# Patient Record
Sex: Male | Born: 1995 | Race: White | Hispanic: No | Marital: Single | Smoking: Former smoker
Health system: Southern US, Community
[De-identification: ages and names within clinical notes are randomized; demographics above are authoritative.]

## PROBLEM LIST (undated history)

## (undated) DIAGNOSIS — S62009A Unspecified fracture of navicular [scaphoid] bone of unspecified wrist, initial encounter for closed fracture: Secondary | ICD-10-CM

## (undated) DIAGNOSIS — F1290 Cannabis use, unspecified, uncomplicated: Secondary | ICD-10-CM

## (undated) DIAGNOSIS — Z8619 Personal history of other infectious and parasitic diseases: Secondary | ICD-10-CM

## (undated) DIAGNOSIS — Z8249 Family history of ischemic heart disease and other diseases of the circulatory system: Secondary | ICD-10-CM

## (undated) HISTORY — DX: Cannabis use, unspecified, uncomplicated: F12.90

## (undated) HISTORY — DX: Personal history of other infectious and parasitic diseases: Z86.19

## (undated) HISTORY — DX: Unspecified fracture of navicular (scaphoid) bone of unspecified wrist, initial encounter for closed fracture: S62.009A

## (undated) HISTORY — DX: Family history of ischemic heart disease and other diseases of the circulatory system: Z82.49

## (undated) HISTORY — PX: NO PAST SURGERIES: SHX2092

---
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
## 2016-01-01 NOTE — Telephone Encounter (Signed)
Rx request Denied; Last Rx to pharmacy 11/21/15, dipense 60 with 10 refills-Too Soon/SLS 11/27

## 2016-01-08 ENCOUNTER — Other Ambulatory Visit: Payer: Self-pay | Admitting: Family Medicine

## 2016-01-11 NOTE — Telephone Encounter (Signed)
[facility] and he found the 11/21/15 renewal and will get rx ready. Pt voiced understanding.

## 2016-02-18 ENCOUNTER — Telehealth: Payer: Self-pay | Admitting: Family Medicine

## 2016-02-18 MED ORDER — BUPROPION HCL ER (SR) 150 MG PO TB12: 150.0000 mg | ORAL_TABLET | Freq: Every day | ORAL | 1 refills | Status: DC

## 2016-02-18 NOTE — Telephone Encounter (Signed)
Due to formulary change, mom requests change wellbutrin to 150mg  dose. Sent in.  plz clarify with patient or mom - wellbutrin 150mg  SR should not be cut in half. Pt previously on 75mg  BID, will change to 150mg  SR once daily so overall same dose. Let me know if any questions.

## 2016-02-19 NOTE — Telephone Encounter (Signed)
Message left advising patient's mother.  

## 2016-05-14 ENCOUNTER — Encounter: Payer: Self-pay | Admitting: Family Medicine

## 2016-05-14 ENCOUNTER — Ambulatory Visit (INDEPENDENT_AMBULATORY_CARE_PROVIDER_SITE_OTHER): Payer: 59 | Admitting: Family Medicine

## 2016-05-14 VITALS — BP 112/76 | HR 100 | Temp 99.0°F | Wt 158.5 lb

## 2016-05-14 DIAGNOSIS — J029 Acute pharyngitis, unspecified: Secondary | ICD-10-CM | POA: Diagnosis not present

## 2016-05-14 LAB — POCT RAPID STREP A (OFFICE): Rapid Strep A Screen: NEGATIVE

## 2016-05-14 NOTE — Progress Notes (Signed)
Pre visit review using our clinic review tool, if applicable. No additional management support is needed unless otherwise documented below in the visit note. 

## 2016-05-14 NOTE — Patient Instructions (Signed)
You have acute pharyngitis. Push fluids and plenty of rest. May use ibuprofen for throat inflammation.  Salt water gargles.  Suck on cold things like popsicles or warm things like herbal teas (whichever soothes the throat better). Return if fever >101.5, worsening pain, or trouble opening/closing mouth, or hoarse voice. Good to see you today, call clinic with questions.  

## 2016-05-14 NOTE — Assessment & Plan Note (Signed)
RST negative today.  Anticipate viral given short duration Supportive care reviewed. Update if persistent symptoms or red flags. Pt agrees with plan.  Work and school notes provided today.

## 2016-05-14 NOTE — Progress Notes (Signed)
BP 112/76   Pulse 100   Temp 99 F (37.2 C) (Oral)   Wt 158 lb 8 oz (71.9 kg)   BMI 20.91 kg/m    CC: ST, fever Subjective:    Patient ID: Edward Murillo, male    DOB: 1995-08-02, 21 y.o.   MRN: [id number]  HPI: Edward Murillo is a 21 y.o. male presenting on 05/14/2016 for Sore Throat   3d h/o ST and PNDrainage with low grade fever. Mild nausea overnight.   Treating with advil and benadryl with some improvement No sick contacts at home. No h/o asthma Quit smoking several months ago.   No ear pain, tooth pain, headaches, congestion, cough, swollen glands. No abd pain. No increased fatigue.   Relevant past medical, surgical, family and social history reviewed and updated as indicated. Interim medical history since our last visit reviewed. Allergies and medications reviewed and updated. Outpatient Medications Prior to Visit  Medication Sig Dispense Refill  . buPROPion (WELLBUTRIN SR) 150 MG 12 hr tablet Take 1 tablet (150 mg total) by mouth daily. 90 tablet 1   No facility-administered medications prior to visit.      Per HPI unless specifically indicated in ROS section below Review of Systems     Objective:    BP 112/76   Pulse 100   Temp 99 F (37.2 C) (Oral)   Wt 158 lb 8 oz (71.9 kg)   BMI 20.91 kg/m   Wt Readings from Last 3 Encounters:  05/14/16 158 lb 8 oz (71.9 kg) (57 %, Z= 0.16)*  11/21/15 161 lb 12 oz (73.4 kg) (64 %, Z= 0.35)*  11/10/15 155 lb (70.3 kg) (54 %, Z= 0.10)*   * Growth percentiles are based on CDC 2-20 Years data.    Physical Exam  Constitutional: He appears well-developed and well-nourished. No distress.  HENT:  Head: Normocephalic and atraumatic.  Right Ear: Hearing, tympanic membrane, external ear and ear canal normal.  Left Ear: Hearing, tympanic membrane, external ear and ear canal normal.  Nose: Nose normal. No mucosal edema or rhinorrhea. Right sinus exhibits no maxillary sinus tenderness and no frontal sinus tenderness. Left  sinus exhibits no maxillary sinus tenderness and no frontal sinus tenderness.  Mouth/Throat: Uvula is midline and mucous membranes are normal. Posterior oropharyngeal edema and posterior oropharyngeal erythema present. No oropharyngeal exudate or tonsillar abscesses.  Raw posterior oropharynx Mild nasal mucosal congestion  Eyes: Conjunctivae and EOM are normal. Pupils are equal, round, and reactive to light. No scleral icterus.  Neck: Normal range of motion. Neck supple.  Cardiovascular: Normal rate, regular rhythm, normal heart sounds and intact distal pulses.   No murmur heard. Pulmonary/Chest: Effort normal and breath sounds normal. No respiratory distress. He has no wheezes. He has no rales.  Abdominal: There is no hepatosplenomegaly. There is no tenderness.  Lymphadenopathy:    He has no cervical adenopathy.  Skin: Skin is warm and dry. No rash noted.  Nursing note and vitals reviewed.  Results for orders placed or performed in visit on 05/14/16  POCT rapid strep A  Result Value Ref Range   Rapid Strep A Screen Negative Negative      Assessment & Plan:   Problem List Items Addressed This Visit    Viral pharyngitis - Primary    RST negative today.  Anticipate viral given short duration Supportive care reviewed. Update if persistent symptoms or red flags. Pt agrees with plan.  Work and school notes provided today.  Other Visit Diagnoses    Sore throat       Relevant Orders   POCT rapid strep A (Completed)       Follow up plan: Return if symptoms worsen or fail to improve.  Edward Boyden, MD

## 2016-06-04 ENCOUNTER — Emergency Department (HOSPITAL_COMMUNITY): Payer: 59

## 2016-06-04 ENCOUNTER — Encounter (HOSPITAL_COMMUNITY): Payer: Self-pay | Admitting: Emergency Medicine

## 2016-06-04 ENCOUNTER — Emergency Department (HOSPITAL_COMMUNITY)
Admission: EM | Admit: 2016-06-04 | Discharge: 2016-06-04 | Disposition: A | Discharge status: Home / Self Care | Payer: 59 | Attending: Emergency Medicine | Admitting: Emergency Medicine

## 2016-06-04 DIAGNOSIS — Z87891 Personal history of nicotine dependence: Secondary | ICD-10-CM | POA: Insufficient documentation

## 2016-06-04 DIAGNOSIS — R109 Unspecified abdominal pain: Secondary | ICD-10-CM | POA: Diagnosis present

## 2016-06-04 DIAGNOSIS — N200 Calculus of kidney: Secondary | ICD-10-CM | POA: Diagnosis not present

## 2016-06-04 LAB — URINALYSIS, MICROSCOPIC (REFLEX)

## 2016-06-04 LAB — BASIC METABOLIC PANEL
Anion gap: 9 (ref 5–15)
BUN: 10 mg/dL (ref 6–20)
CHLORIDE: 104 mmol/L (ref 101–111)
CO2: 26 mmol/L (ref 22–32)
CREATININE: 1.04 mg/dL (ref 0.61–1.24)
Calcium: 9.9 mg/dL (ref 8.9–10.3)
GFR calc Af Amer: >60 mL/min (ref >60)
GFR calc non Af Amer: >60 mL/min (ref >60)
Glucose, Bld: 113 mg/dL — ABNORMAL HIGH (ref 65–99)
Potassium: 3.9 mmol/L (ref 3.5–5.1)
Sodium: 139 mmol/L (ref 135–145)

## 2016-06-04 LAB — CBC
HCT: 43.7 % (ref 39.0–52.0)
Hemoglobin: 14.5 g/dL (ref 13.0–17.0)
MCH: 29.4 pg (ref 26.0–34.0)
MCHC: 33.2 g/dL (ref 30.0–36.0)
MCV: 88.5 fL (ref 78.0–100.0)
PLATELETS: 394 10*3/uL (ref 150–400)
RBC: 4.94 MIL/uL (ref 4.22–5.81)
RDW: 13.3 % (ref 11.5–15.5)
WBC: 11.9 10*3/uL — ABNORMAL HIGH (ref 4.0–10.5)

## 2016-06-04 LAB — URINALYSIS, ROUTINE W REFLEX MICROSCOPIC
Glucose, UA: NEGATIVE mg/dL
Ketones, ur: >80 mg/dL — AB
Nitrite: NEGATIVE
Protein, ur: 100 mg/dL — AB
pH: 6 (ref 5.0–8.0)

## 2016-06-04 NOTE — Discharge Instructions (Signed)
Please read attached information. If you experience any new or worsening signs or symptoms please return to the emergency room for evaluation. Please follow-up with your primary care provider or specialist as discussed.  °

## 2016-06-04 NOTE — ED Notes (Signed)
PT requesting food- this rn notified him we would provide food once CT results

## 2016-06-04 NOTE — ED Provider Notes (Signed)
[facility]-EMERGENCY DEPT Provider Note   CSN: [id number] Arrival date & time: 06/04/16  0714     History   Chief Complaint Chief Complaint  Patient presents with  . Flank Pain    HPI Edward Murillo is a 21 y.o. male.  HPI   21 year old male presents today with left flank pain.  Patient reports yesterday he had lower back pain which is not abnormal for him.  He notes that this acutely worsened with a sharp stabbing pain higher up in his flank.  He notes this pain radiated around into his left groin.  He notes this was very severe, associated with nausea and vomiting.  He denies any preceding trauma, infectious etiology.  He reports a family history of kidney stones, no personal history of the same.  He denies any preceding urinary complaints, reports that his urine has appeared slightly cloudy.  Prior to my evaluation patient notes that symptoms have completely resolved and is no longer symptomatic.  He denies any abdominal pain, flank pain, nausea or vomiting at this time.   Past Medical History:  Diagnosis Date  . Family history of premature CAD   . History of mononucleosis 11/2013  . Marijuana smoker   . Scaphoid fracture of wrist 2009   R - treated with thumb spica splint    Patient Active Problem List   Diagnosis Date Noted  . Viral pharyngitis 05/14/2016  . Depression with anxiety 10/24/2015  . Well adolescent visit 03/04/2014  . Tremor 03/04/2014    Past Surgical History:  Procedure Laterality Date  . CLOSED REDUCTION METACARPAL WITH PERCUTANEOUS PINNING Right 10/17/2014   Procedure: RIGHT FOURTH AND FIFTH [facility] CLOSED MANIPULATION AND PINNING;  Surgeon: Bradly Bienenstock, MD;  Location: [facility] OR;  Service: Orthopedics;  Laterality: Right;  . NO PAST SURGERIES         Home Medications    Prior to Admission medications   Medication Sig Start Date End Date Taking? Authorizing Provider  acetaminophen (TYLENOL) 325 MG tablet Take 650 mg by mouth every 6 (six) hours as needed for  mild pain.   Yes Historical Provider, MD  buPROPion (WELLBUTRIN SR) 150 MG 12 hr tablet Take 1 tablet (150 mg total) by mouth daily. 02/18/16  Yes Eustaquio Boyden, MD  ibuprofen (ADVIL,MOTRIN) 200 MG tablet Take 200 mg by mouth every 6 (six) hours as needed for moderate pain.   Yes Historical Provider, MD    Family History Family History  Problem Relation Age of Onset  . CAD Mother 49    MI  . CAD Father 26    MI  . CAD Maternal Grandfather     CABG  . Cancer Paternal Grandmother     lung (smoker)  . Cancer Paternal Grandfather     leukemia  . Sudden death Paternal Grandfather     unsure  . Alcohol abuse Maternal Uncle   . Drug abuse Brother     lives in [place]    Social History Social History  Substance Use Topics  . Smoking status: Former Smoker    Types: Cigarettes  . Smokeless tobacco: Never Used     Comment: quit early 2018  . Alcohol use 0.0 oz/week     Comment: rare, no hangovers     Allergies   Amoxicillin   Review of Systems Review of Systems  All other systems reviewed and are negative.  Physical Exam Updated Vital Signs BP 117/68   Pulse 66   Temp 98 F (36.7 C) (Oral)  Resp 16   SpO2 99%   Physical Exam  Constitutional: He is oriented to person, place, and time. He appears well-developed and well-nourished.  HENT:  Head: Normocephalic and atraumatic.  Eyes: Conjunctivae are normal. Pupils are equal, round, and reactive to light. Right eye exhibits no discharge. Left eye exhibits no discharge. No scleral icterus.  Neck: Normal range of motion. No JVD present. No tracheal deviation present.  Pulmonary/Chest: Effort normal. No stridor.  Abdominal: Soft. He exhibits no distension and no mass. There is no tenderness. There is no rebound and no guarding. No hernia.  No CVA tenderness  Neurological: He is alert and oriented to person, place, and time. Coordination normal.  Psychiatric: He has a normal mood and affect. His behavior is normal.  Judgment and thought content normal.  Nursing note and vitals reviewed.   ED Treatments / Results  Labs (all labs ordered are listed, but only abnormal results are displayed) Labs Reviewed  URINALYSIS, ROUTINE W REFLEX MICROSCOPIC - Abnormal; Notable for the following:       Result Value   Color, Urine BROWN (*)    APPearance CLOUDY (*)    Specific Gravity, Urine >1.030 (*)    Hgb urine dipstick LARGE (*)    Bilirubin Urine SMALL (*)    Ketones, ur >80 (*)    Protein, ur 100 (*)    Leukocytes, UA TRACE (*)    All other components within normal limits  BASIC METABOLIC PANEL - Abnormal; Notable for the following:    Glucose, Bld 113 (*)    All other components within normal limits  CBC - Abnormal; Notable for the following:    WBC 11.9 (*)    All other components within normal limits  URINALYSIS, MICROSCOPIC (REFLEX) - Abnormal; Notable for the following:    Bacteria, UA FEW (*)    Squamous Epithelial / LPF 0-5 (*)    All other components within normal limits    EKG  EKG Interpretation None       Radiology Ct Renal Stone Study  Result Date: 06/04/2016 CLINICAL DATA:  Left flank pain, nausea, hematuria EXAM: CT ABDOMEN AND PELVIS WITHOUT CONTRAST TECHNIQUE: Multidetector CT imaging of the abdomen and pelvis was performed following the standard protocol without IV contrast. COMPARISON:  None. FINDINGS: Lower chest: Lung bases are clear. Hepatobiliary: Unenhanced liver is unremarkable. Gallbladder is unremarkable. No intrahepatic or extrahepatic duct dilatation. Pancreas: Within normal limits. Spleen: Within normal limits. Adrenals/Urinary Tract: Adrenal glands are within normal limits. Punctate nonobstructing right upper pole renal calculus (coronal image 72). Kidneys are otherwise within normal limits. No ureteral or bladder calculi. No hydronephrosis. Bladder is within normal limits. Stomach/Bowel: Stomach is within normal limits. No evidence of bowel obstruction. Normal  appendix (series 3/ image 65). Vascular/Lymphatic: No evidence of abdominal aortic aneurysm. No suspicious abdominopelvic lymphadenopathy. Reproductive: Prostate is grossly unremarkable. Other: No abdominopelvic ascites. Musculoskeletal: Visualized osseous structures are within normal limits. IMPRESSION: Punctate nonobstructing right upper pole renal calculus. No ureteral or bladder calculi.  No hydronephrosis. Electronically Signed   By: Charline Bills M.D.   On: 06/04/2016 12:03    Procedures Procedures (including critical care time)  Medications Ordered in ED Medications - No data to display   Initial Impression / Assessment and Plan / ED Course  I have reviewed the triage vital signs and the nursing notes.  Pertinent labs & imaging results that were available during my care of the patient were reviewed by me and considered in my medical  decision making (see chart for details).    Final Clinical Impressions(s) / ED Diagnoses   Final diagnoses:  Nephrolithiasis    Labs: CBC, BMP, urinalysis  Imaging: CT renal  Assessment/Plan: Patient's presentation is consistent with nephrolithiasis.  Patient does not have a personal history of it, but has close family members with similar presentations and history nephrolithiasis.  I advised that CT scan would be most reasonable for evaluation of stone size, location, hydronephrosis, or any complicating features.  Mother is at bedside who questions need of CT at this time.  She would like to obtain urinalysis prior to any imaging study.  That shows significant red blood cells, no signs of infection.  Patient does not have any previous imaging, likely this is nephrolithiasis.  Both the patient and his mother and agreement with CT scan to rule out obstruction and to verify stone..  CT scan returned showing right-sided nephrolithiasis, no left-sided involvement.  Patient likely has Artie passed this stone given the urinalysis and presentation.  He  has remained asymptomatic throughout his stay here in the ED.  He will be referred to urology for ongoing management of nephrolithiasis, is given strict return precautions.  He verbalized understanding and agreement to today's plan and no further questions or concerns.  New Prescriptions New Prescriptions   No medications on file     Eyvonne Mechanic, PA-C 06/04/16 1237    Canary Brim Tegeler, MD 06/04/16 [phone]

## 2016-06-04 NOTE — ED Notes (Signed)
Family at bedside.  Got patient ready for discharge

## 2016-06-04 NOTE — ED Notes (Signed)
Patient given something to drink per EDP approval  

## 2016-06-04 NOTE — ED Notes (Signed)
Patient aware that a urine sample is needed 

## 2016-06-04 NOTE — ED Notes (Signed)
Patient transported to CT 

## 2016-06-04 NOTE — ED Triage Notes (Signed)
Pt reports left flank pain and vomiting since yesterday. Reports pain has subsided at this time, family hx of kidney stones.

## 2016-07-15 ENCOUNTER — Other Ambulatory Visit: Payer: Self-pay | Admitting: Family Medicine

## 2016-10-04 ENCOUNTER — Other Ambulatory Visit: Payer: Self-pay | Admitting: Family Medicine

## 2018-09-11 ENCOUNTER — Other Ambulatory Visit: Payer: Self-pay

## 2018-09-11 ENCOUNTER — Encounter (HOSPITAL_COMMUNITY): Payer: Self-pay | Admitting: Emergency Medicine

## 2018-09-11 ENCOUNTER — Emergency Department (HOSPITAL_COMMUNITY)
Admission: EM | Admit: 2018-09-11 | Discharge: 2018-09-11 | Disposition: A | Discharge status: Home / Self Care | Payer: 59 | Attending: Emergency Medicine | Admitting: Emergency Medicine

## 2018-09-11 DIAGNOSIS — R310 Gross hematuria: Secondary | ICD-10-CM | POA: Insufficient documentation

## 2018-09-11 DIAGNOSIS — Z79899 Other long term (current) drug therapy: Secondary | ICD-10-CM | POA: Insufficient documentation

## 2018-09-11 LAB — URINALYSIS, ROUTINE W REFLEX MICROSCOPIC

## 2018-09-11 LAB — URINALYSIS, MICROSCOPIC (REFLEX)

## 2018-09-11 MED ORDER — HYDROCODONE-ACETAMINOPHEN 5-325 MG PO TABS
1.0000 | ORAL_TABLET | Freq: Once | ORAL | Status: AC
  Administered 2018-09-11: 1 via ORAL
  Filled 2018-09-11: qty 1

## 2018-09-11 NOTE — ED Provider Notes (Signed)
[facility] EMERGENCY DEPARTMENT Provider Note   CSN: [id number] Arrival date & time: 09/11/18  0932    History   Chief Complaint No chief complaint on file.   HPI Edward Murillo is a 23 y.o. male.     The history is provided by the patient and medical records. No language interpreter was used.   Edward Murillo is a 23 y.o. male  with a PMH as listed below who presents to the Emergency Department complaining of hematuria.  Patient states that he has a history of kidney stones in the past.  2 days ago, he had left back and flank pain which felt similar to his prior stone.  Yesterday, he felt as if his pain peaked.  Associated with nausea and a couple episodes of emesis.  His pain started radiating towards the left lower quadrant.  Today, he feels as if his pain is much better.  Went from 8-9/10 down to 2/10.  Mostly just feels a little uncomfortable with urination.  When he urinated today, he noticed blood in his urine which concerned him prompting him to come to the emergency department.  He denies any fever or chills.  Currently not nauseous and has not had any vomiting today.  Overall feels much improved, but concerned about hematuria.  Past Medical History:  Diagnosis Date   Family history of premature CAD    History of mononucleosis 11/2013   Marijuana smoker    Scaphoid fracture of wrist 2009   R - treated with thumb spica splint    Patient Active Problem List   Diagnosis Date Noted   Viral pharyngitis 05/14/2016   Depression with anxiety 10/24/2015   Well adolescent visit 03/04/2014   Tremor 03/04/2014    Past Surgical History:  Procedure Laterality Date   CLOSED REDUCTION METACARPAL WITH PERCUTANEOUS PINNING Right 10/17/2014   Procedure: RIGHT FOURTH AND FIFTH [facility] CLOSED MANIPULATION AND PINNING;  Surgeon: Bradly BienenstockFred Ortmann, MD;  Location: [facility] OR;  Service: Orthopedics;  Laterality: Right;   NO PAST SURGERIES          Home Medications     Prior to Admission medications   Medication Sig Start Date End Date Taking? Authorizing Provider  acetaminophen (TYLENOL) 325 MG tablet Take 650 mg by mouth every 6 (six) hours as needed for mild pain.    [provider]  buPROPion [facility](WELLBUTRIN SR) 150 MG 12 hr tablet TAKE 1 TABLET BY MOUTH  DAILY 10/05/16   Eustaquio BoydenGutierrez, Javier, MD  ibuprofen (ADVIL,MOTRIN) 200 MG tablet Take 200 mg by mouth every 6 (six) hours as needed for moderate pain.    [provider]    Family History Family History  Problem Relation Age of Onset   CAD Mother 9949       MI   CAD Father 5553       MI   CAD Maternal Grandfather        CABG   Cancer Paternal Grandmother        lung (smoker)   Cancer Paternal Grandfather        leukemia   Sudden death Paternal Grandfather        unsure   Alcohol abuse Maternal Uncle    Drug abuse Brother        lives in [place]    Social History Social History   Tobacco Use   Smoking status: Former Smoker    Types: Cigarettes   Smokeless tobacco: Never Used   Tobacco comment: quit  early 2018  Substance Use Topics   Alcohol use: Yes    Alcohol/week: 0.0 standard drinks    Comment: rare, no hangovers   Drug use: Yes    Types: Marijuana    Comment: MJ occasionally     Allergies   Amoxicillin   Review of Systems Review of Systems  Gastrointestinal: Positive for abdominal pain, nausea (Resolved) and vomiting (Resolved).  Genitourinary: Positive for dysuria, flank pain (Resolved) and hematuria.  Musculoskeletal: Positive for back pain (Resolved).  All other systems reviewed and are negative.    Physical Exam Updated Vital Signs BP 111/65    Pulse 64    Temp 98.2 F (36.8 C) (Oral)    Resp 16    Ht 6\' 2"  (1.88 m)    Wt 68 kg    SpO2 98%    BMI 19.26 kg/m   Physical Exam Vitals signs and nursing note reviewed.  Constitutional:      General: He is not in acute distress.    Appearance: He is well-developed.     Comments:  Nontoxic, well appearing.  HENT:     Head: Normocephalic and atraumatic.  Neck:     Musculoskeletal: Neck supple.  Cardiovascular:     Rate and Rhythm: Normal rate and regular rhythm.     Heart sounds: Normal heart sounds. No murmur.  Pulmonary:     Effort: Pulmonary effort is normal. No respiratory distress.     Breath sounds: Normal breath sounds.  Abdominal:     General: There is no distension.     Palpations: Abdomen is soft.     Comments: No abdominal, flank or CVA tenderness.  Skin:    General: Skin is warm and dry.  Neurological:     Mental Status: He is alert and oriented to person, place, and time.      ED Treatments / Results  Labs (all labs ordered are listed, but only abnormal results are displayed) Labs Reviewed  URINALYSIS, ROUTINE W REFLEX MICROSCOPIC - Abnormal; Notable for the following components:      Result Value   Color, Urine RED (*)    APPearance HAZY (*)    Glucose, UA   (*)    Value: TEST NOT REPORTED DUE TO COLOR INTERFERENCE OF URINE PIGMENT   Hgb urine dipstick   (*)    Value: TEST NOT REPORTED DUE TO COLOR INTERFERENCE OF URINE PIGMENT   Bilirubin Urine   (*)    Value: TEST NOT REPORTED DUE TO COLOR INTERFERENCE OF URINE PIGMENT   Ketones, ur   (*)    Value: TEST NOT REPORTED DUE TO COLOR INTERFERENCE OF URINE PIGMENT   Protein, ur   (*)    Value: TEST NOT REPORTED DUE TO COLOR INTERFERENCE OF URINE PIGMENT   Nitrite   (*)    Value: TEST NOT REPORTED DUE TO COLOR INTERFERENCE OF URINE PIGMENT   Leukocytes,Ua   (*)    Value: TEST NOT REPORTED DUE TO COLOR INTERFERENCE OF URINE PIGMENT   All other components within normal limits  URINALYSIS, MICROSCOPIC (REFLEX) - Abnormal; Notable for the following components:   Bacteria, UA RARE (*)    All other components within normal limits    EKG None  Radiology No results found.  Procedures Procedures (including critical care time)  Medications Ordered in ED Medications   HYDROcodone-acetaminophen (NORCO/VICODIN) 5-325 MG per tablet 1 tablet (1 tablet Oral Given 09/11/18 1054)     Initial Impression / Assessment and Plan /  ED Course  I have reviewed the triage vital signs and the nursing notes.  Pertinent labs & imaging results that were available during my care of the patient were reviewed by me and considered in my medical decision making (see chart for details).       Edward Murillo is a 23 y.o. male who presents to ED for hematuria.  He has known history of kidney stones and had back / flank pain similar to previous stones 2 days ago and yesterday.  Today, he feels as if his pain is much better.  Overall, he feels very much improved, but had hematuria when urinating 2 times today which concerned him and prompted him to come to the emergency department.  On exam, he is afebrile, very well-appearing with normal vital signs. He has no abdominal, flank or CVA tenderness. History sounds most consistent with passing a kidney stone.  He reports being very afraid of needles and would like to avoid any blood draws or needles if possible.  Considering he has normal physical examination and improving symptoms, feel it is reasonable in this otherwise healthy 23 year old male to hold off on labs.  Given dysuria/hematuria will check a urine sample to make sure there is no signs of infection, but feel hematuria is likely secondary to recently passed stone.  UA difficult to interpret given interference of urine pigment 2/2 RBC'w. Does show 11-20 WBC with rare bacteria. Doubt UTI. Did discuss strict return precautions including fever, vomiting, return of back/flank/abdominal pain. Urology referral given. Again offered further work up which patient declined. All questions answered.   Final Clinical Impressions(s) / ED Diagnoses   Final diagnoses:  Gross hematuria    ED Discharge Orders    None       Delayza Lungren, Ozella Almond, PA-C 09/11/18 1212    Maudie Flakes,  MD 09/14/18 [phone]

## 2018-09-11 NOTE — Discharge Instructions (Signed)
It was my pleasure taking care of you today!   Please call the urology clinic listed to schedule a follow up appointment.   Return to ER for fever, return of severe abdominal pain / vomiting, new or worsening symptoms, any additional concerns.

## 2018-09-11 NOTE — ED Triage Notes (Signed)
Pt  Here with c/o kidney stone pain history of same , pt states that he is noticeably blood in his urine pt thinks he has almost passed the stone

## 2018-09-11 NOTE — ED Notes (Signed)
Patient verbalizes understanding of discharge instructions. Opportunity for questioning and answers were provided. Pt discharged from ED. 

## 2018-11-23 ENCOUNTER — Other Ambulatory Visit: Payer: Self-pay

## 2018-11-23 DIAGNOSIS — Z20822 Contact with and (suspected) exposure to covid-19: Secondary | ICD-10-CM

## 2018-11-25 LAB — NOVEL CORONAVIRUS, NAA: SARS-CoV-2, NAA: NOT DETECTED

## 2019-02-01 ENCOUNTER — Other Ambulatory Visit: Payer: Self-pay

## 2019-02-01 ENCOUNTER — Encounter (HOSPITAL_COMMUNITY): Payer: Self-pay

## 2019-02-01 ENCOUNTER — Ambulatory Visit (HOSPITAL_COMMUNITY)
Admission: EM | Admit: 2019-02-01 | Discharge: 2019-02-01 | Disposition: A | Discharge status: Home / Self Care | Payer: Self-pay | Attending: Physician Assistant | Admitting: Physician Assistant

## 2019-02-01 DIAGNOSIS — R112 Nausea with vomiting, unspecified: Secondary | ICD-10-CM

## 2019-02-01 DIAGNOSIS — R197 Diarrhea, unspecified: Secondary | ICD-10-CM

## 2019-02-01 MED ORDER — OMEPRAZOLE 20 MG PO CPDR: 20.0000 mg | DELAYED_RELEASE_CAPSULE | Freq: Every day | ORAL | 0 refills | Status: AC

## 2019-02-01 NOTE — Discharge Instructions (Signed)
Take the prilosec daily for 14 days   Eat high fiber to include fresh vegetables and whole grains.  Diary your food intake for 2 weeks.  If you do not have improvement in symptoms in 2 weeks, return or follow up with your primary care.

## 2019-02-01 NOTE — ED Triage Notes (Signed)
Pt. States he has had abdominal pain for a few mths now on & off.

## 2019-02-01 NOTE — ED Provider Notes (Signed)
[facility]    CSN: [id number] Arrival date & time: 02/01/19  1331      History   Chief Complaint Chief Complaint  Patient presents with  . Abdominal Pain    HPI Edward Murillo is a 23 y.o. male.   Patient reports to urgent care today for multiple months of abdominal discomfort, nausea and intermittent diarrhea. He reports he has nausea atleast 5 times a week with waking. His discomfort is described as central in his abdomen.  He also reports occasions of diarrhea which is described as 4-5 episodes of watery diarrhea. He denies vomiting. He Denies blood in his stool. He does report the stool has been "dark" but does not believe it was black at any point. He can not predict when the symptoms would present and food does not cause or improve symptoms. At times all symptoms occur together. He denies wait loss. He denies fever, chills.    He has not tried any medications.   He is not currently experiencing symptoms.  He does have a history of kidney stones but denies this to be similar to those episodes.     Past Medical History:  Diagnosis Date  . Family history of premature CAD   . History of mononucleosis 11/2013  . Marijuana smoker   . Scaphoid fracture of wrist 2009   R - treated with thumb spica splint    Patient Active Problem List   Diagnosis Date Noted  . Viral pharyngitis 05/14/2016  . Depression with anxiety 10/24/2015  . Well adolescent visit 03/04/2014  . Tremor 03/04/2014    Past Surgical History:  Procedure Laterality Date  . CLOSED REDUCTION METACARPAL WITH PERCUTANEOUS PINNING Right 10/17/2014   Procedure: RIGHT FOURTH AND FIFTH [facility] CLOSED MANIPULATION AND PINNING;  Surgeon: Bradly BienenstockFred Ortmann, MD;  Location: [facility] OR;  Service: Orthopedics;  Laterality: Right;  . NO PAST SURGERIES         Home Medications    Prior to Admission medications   Medication Sig Start Date End Date Taking? Authorizing Provider  acetaminophen (TYLENOL) 325 MG tablet  Take 650 mg by mouth every 6 (six) hours as needed for mild pain.    [provider]  buPROPion [facility](WELLBUTRIN SR) 150 MG 12 hr tablet TAKE 1 TABLET BY MOUTH  DAILY 10/05/16   Eustaquio BoydenGutierrez, Javier, MD  ibuprofen (ADVIL,MOTRIN) 200 MG tablet Take 200 mg by mouth every 6 (six) hours as needed for moderate pain.    [provider]  omeprazole (PRILOSEC) 20 MG capsule Take 1 capsule (20 mg total) by mouth daily. 02/01/19   Won Kreuzer, Veryl SpeakJacob E, PA-C    Family History Family History  Problem Relation Age of Onset  . CAD Mother 7349       MI  . Heart failure Mother   . CAD Father 7053       MI  . Heart failure Father   . CAD Maternal Grandfather        CABG  . Cancer Paternal Grandmother        lung (smoker)  . Cancer Paternal Grandfather        leukemia  . Sudden death Paternal Grandfather        unsure  . Alcohol abuse Maternal Uncle   . Drug abuse Brother        lives in [place]    Social History Social History   Tobacco Use  . Smoking status: Former Smoker    Types: Cigarettes  . Smokeless tobacco:  Never Used  . Tobacco comment: quit early 2018  Substance Use Topics  . Alcohol use: Yes    Alcohol/week: 0.0 standard drinks    Comment: rare, no hangovers  . Drug use: Yes    Types: Marijuana    Comment: MJ occasionally     Allergies   Amoxicillin   Review of Systems Review of Systems  Constitutional: Positive for appetite change. Negative for chills, fatigue and fever.  HENT: Negative for ear pain and sore throat.   Eyes: Negative for pain and visual disturbance.  Respiratory: Negative for cough and shortness of breath.   Cardiovascular: Negative for chest pain and palpitations.  Gastrointestinal: Positive for abdominal pain, diarrhea and nausea. Negative for abdominal distention, blood in stool, constipation, rectal pain and vomiting.  Endocrine: Negative for polyphagia and polyuria.  Genitourinary: Negative for dysuria and hematuria.  Musculoskeletal: Negative  for arthralgias and back pain.  Skin: Negative for color change and rash.  Neurological: Negative for seizures and syncope.  All other systems reviewed and are negative.    Physical Exam Triage Vital Signs ED Triage Vitals  Enc Vitals Group     BP 02/01/19 1444 126/64     Pulse Rate 02/01/19 1444 86     Resp 02/01/19 1444 18     Temp 02/01/19 1444 99 F (37.2 C)     Temp Source 02/01/19 1444 Oral     SpO2 02/01/19 1444 100 %     Weight 02/01/19 1439 156 lb (70.8 kg)     Height --      Head Circumference --      Peak Flow --      Pain Score 02/01/19 1439 0     Pain Loc --      Pain Edu? --      Excl. in GC? --    No data found.  Updated Vital Signs BP 126/64 (BP Location: Right Arm)   Pulse 86   Temp 99 F (37.2 C) (Oral)   Resp 18   Wt 156 lb (70.8 kg)   SpO2 100%   BMI 20.03 kg/m   Visual Acuity Right Eye Distance:   Left Eye Distance:   Bilateral Distance:    Right Eye Near:   Left Eye Near:    Bilateral Near:     Physical Exam Vitals and nursing note reviewed.  Constitutional:      General: He is not in acute distress.    Appearance: He is well-developed and normal weight. He is not ill-appearing.  HENT:     Head: Normocephalic and atraumatic.  Eyes:     Extraocular Movements: Extraocular movements intact.     Conjunctiva/sclera: Conjunctivae normal.     Pupils: Pupils are equal, round, and reactive to light.  Cardiovascular:     Rate and Rhythm: Normal rate and regular rhythm.     Heart sounds: Normal heart sounds. No murmur. No gallop.   Pulmonary:     Effort: Pulmonary effort is normal. No respiratory distress.     Breath sounds: Normal breath sounds.  Abdominal:     General: Abdomen is flat. Bowel sounds are normal. There is no distension. There are no signs of injury.     Palpations: Abdomen is soft.     Tenderness: There is no abdominal tenderness. There is no right CVA tenderness or left CVA tenderness.  Musculoskeletal:     Cervical  back: Neck supple.  Skin:    General: Skin is warm and dry.  Neurological:     General: No focal deficit present.     Mental Status: He is alert and oriented to person, place, and time.  Psychiatric:        Mood and Affect: Mood normal.        Behavior: Behavior normal.      UC Treatments / Results  Labs (all labs ordered are listed, but only abnormal results are displayed) Labs Reviewed - No data to display  EKG   Radiology No results found.  Procedures Procedures (including critical care time)  Medications Ordered in UC Medications - No data to display  Initial Impression / Assessment and Plan / UC Course  I have reviewed the triage vital signs and the nursing notes.  Pertinent labs & imaging results that were available during my care of the patient were reviewed by me and considered in my medical decision making (see chart for details).     N/V/D - consideration for for GERD vs ulcer vs digestive disorder. Trial of PPI for 2 weeks and dietary changes discussed. Instructed to follow up here or with PCP if not improving in 2 weeks.    Final Clinical Impressions(s) / UC Diagnoses   Final diagnoses:  Nausea vomiting and diarrhea     Discharge Instructions     Take the prilosec daily for 14 days   Eat high fiber to include fresh vegetables and whole grains.  Diary your food intake for 2 weeks.  If you do not have improvement in symptoms in 2 weeks, return or follow up with your primary care.     ED Prescriptions    Medication Sig Dispense Auth. Provider   omeprazole (PRILOSEC) 20 MG capsule Take 1 capsule (20 mg total) by mouth daily. 30 capsule Peggye Poon, Marguerita Beards, PA-C     PDMP not reviewed this encounter.   Purnell Shoemaker, PA-C 02/01/19 [phone]

## 2019-12-01 ENCOUNTER — Encounter (HOSPITAL_COMMUNITY): Payer: Self-pay

## 2019-12-01 ENCOUNTER — Emergency Department (HOSPITAL_COMMUNITY): Payer: Self-pay

## 2019-12-01 ENCOUNTER — Emergency Department (HOSPITAL_COMMUNITY)
Admission: EM | Admit: 2019-12-01 | Discharge: 2019-12-01 | Disposition: A | Discharge status: Home / Self Care | Payer: Self-pay | Attending: Emergency Medicine | Admitting: Emergency Medicine

## 2019-12-01 ENCOUNTER — Other Ambulatory Visit: Payer: Self-pay

## 2019-12-01 DIAGNOSIS — Z87891 Personal history of nicotine dependence: Secondary | ICD-10-CM | POA: Insufficient documentation

## 2019-12-01 DIAGNOSIS — N201 Calculus of ureter: Secondary | ICD-10-CM

## 2019-12-01 DIAGNOSIS — N132 Hydronephrosis with renal and ureteral calculous obstruction: Secondary | ICD-10-CM | POA: Insufficient documentation

## 2019-12-01 DIAGNOSIS — I2510 Atherosclerotic heart disease of native coronary artery without angina pectoris: Secondary | ICD-10-CM | POA: Insufficient documentation

## 2019-12-01 LAB — URINALYSIS, ROUTINE W REFLEX MICROSCOPIC
Bilirubin Urine: NEGATIVE
Glucose, UA: NEGATIVE mg/dL
Ketones, ur: 5 mg/dL — AB
Nitrite: NEGATIVE
Protein, ur: 30 mg/dL — AB
RBC / HPF: >50 RBC/hpf — ABNORMAL HIGH (ref 0–5)
Specific Gravity, Urine: 1.019 (ref 1.005–1.030)
pH: 5 (ref 5.0–8.0)

## 2019-12-01 MED ORDER — ONDANSETRON 4 MG PO TBDP
4.0000 mg | ORAL_TABLET | Freq: Once | ORAL | Status: AC
  Administered 2019-12-01: 4 mg via ORAL
  Filled 2019-12-01: qty 1

## 2019-12-01 MED ORDER — LORAZEPAM 1 MG PO TABS
1.0000 mg | ORAL_TABLET | Freq: Once | ORAL | Status: AC
  Administered 2019-12-01: 1 mg via ORAL
  Filled 2019-12-01: qty 1

## 2019-12-01 MED ORDER — IBUPROFEN 800 MG PO TABS
800.0000 mg | ORAL_TABLET | Freq: Once | ORAL | Status: AC
  Administered 2019-12-01: 800 mg via ORAL
  Filled 2019-12-01: qty 1

## 2019-12-01 MED ORDER — KETOROLAC TROMETHAMINE 60 MG/2ML IM SOLN: 60.0000 mg | Freq: Once | INTRAMUSCULAR | Status: DC

## 2019-12-01 MED ORDER — HYDROCODONE-ACETAMINOPHEN 5-325 MG PO TABS: 1.0000 | ORAL_TABLET | Freq: Four times a day (QID) | ORAL | 0 refills | Status: AC | PRN

## 2019-12-01 MED ORDER — TAMSULOSIN HCL 0.4 MG PO CAPS: 0.4000 mg | ORAL_CAPSULE | Freq: Every day | ORAL | 0 refills | Status: AC

## 2019-12-01 MED ORDER — IBUPROFEN 600 MG PO TABS: 600.0000 mg | ORAL_TABLET | Freq: Four times a day (QID) | ORAL | 0 refills | Status: AC | PRN

## 2019-12-01 NOTE — ED Provider Notes (Signed)
[facility] EMERGENCY DEPARTMENT Provider Note   CSN: [id number] Arrival date & time: 12/01/19  1121     History Chief Complaint  Patient presents with  . Flank Pain    Edward Murillo is a 24 y.o. male who presents to the ED with acute onset right-sided flank pain and urinary hesitance.  Patient tells me that he felt peripherally fine last evening and then upon waking up this morning developed acute onset 10 out of 10 right-sided flank pain that radiated to his right lower quadrant and groin.  He reports that he has a history of ureterolithiasis.  He has had some nausea, but no emesis.  He states that this nausea is due to the pain symptoms.  He has not taken anything for his symptoms.  He has a fear of needles and does not want IV placed or blood work obtained.  He denies any history of renal impairment.  No recent fevers or chills.  He is having sweats, but he attributes it to his pain symptoms.  His pain is waxing and waning.  Currently it is now 5 out of 10.  HPI     Past Medical History:  Diagnosis Date  . Family history of premature CAD   . History of mononucleosis 11/2013  . Marijuana smoker   . Scaphoid fracture of wrist 2009   R - treated with thumb spica splint    Patient Active Problem List   Diagnosis Date Noted  . Viral pharyngitis 05/14/2016  . Depression with anxiety 10/24/2015  . Well adolescent visit 03/04/2014  . Tremor 03/04/2014    Past Surgical History:  Procedure Laterality Date  . CLOSED REDUCTION METACARPAL WITH PERCUTANEOUS PINNING Right 10/17/2014   Procedure: RIGHT FOURTH AND FIFTH [facility] CLOSED MANIPULATION AND PINNING;  Surgeon: Bradly Bienenstock, MD;  Location: [facility];  Service: Orthopedics;  Laterality: Right;  . NO PAST SURGERIES         Family History  Problem Relation Age of Onset  . CAD Mother 65       MI  . Heart failure Mother   . CAD Father 24       MI  . Heart failure Father   . CAD Maternal Grandfather         CABG  . Cancer Paternal Grandmother        lung (smoker)  . Cancer Paternal Grandfather        leukemia  . Sudden death Paternal Grandfather        unsure  . Alcohol abuse Maternal Uncle   . Drug abuse Brother        lives in [place]    Social History   Tobacco Use  . Smoking status: Former Smoker    Types: Cigarettes  . Smokeless tobacco: Never Used  . Tobacco comment: quit early 2018  Substance Use Topics  . Alcohol use: Yes    Alcohol/week: 0.0 standard drinks    Comment: rare, no hangovers  . Drug use: Yes    Types: Marijuana    Comment: MJ occasionally    Home Medications Prior to Admission medications   Medication Sig Start Date End Date Taking? Authorizing Provider  acetaminophen (TYLENOL) 325 MG tablet Take 650 mg by mouth every 6 (six) hours as needed for mild pain.   Yes [provider]  ibuprofen (ADVIL) 200 MG tablet Take 200 mg by mouth every 6 (six) hours as needed for headache or mild pain.   Yes [provider]  buPROPion (WELLBUTRIN SR) 150 MG 12 hr tablet TAKE 1 TABLET BY MOUTH  DAILY Patient not taking: Reported on 12/01/2019 10/05/16   Eustaquio Boyden, MD  HYDROcodone-acetaminophen (NORCO/VICODIN) 5-325 MG tablet Take 1 tablet by mouth every 6 (six) hours as needed for severe pain. 12/01/19   Lorelee New, PA-C  ibuprofen (ADVIL) 600 MG tablet Take 1 tablet (600 mg total) by mouth every 6 (six) hours as needed. 12/01/19   Lorelee New, PA-C  omeprazole (PRILOSEC) 20 MG capsule Take 1 capsule (20 mg total) by mouth daily. Patient not taking: Reported on 12/01/2019 02/01/19   Darr, Gerilyn Pilgrim, PA-C  tamsulosin (FLOMAX) 0.4 MG CAPS capsule Take 1 capsule (0.4 mg total) by mouth daily for 10 days. 12/01/19 12/11/19  Lorelee New, PA-C    Allergies    Amoxicillin  Review of Systems   Review of Systems  All other systems reviewed and are negative.   Physical Exam Updated Vital Signs BP 130/80   Pulse 64   Temp 98.5 F (36.9  C) (Oral)   Resp 20   SpO2 98%   Physical Exam Vitals and nursing note reviewed. Exam conducted with a chaperone present.  Constitutional:      Comments: In discomfort.  HENT:     Head: Normocephalic and atraumatic.  Eyes:     General: No scleral icterus.    Conjunctiva/sclera: Conjunctivae normal.  Cardiovascular:     Rate and Rhythm: Normal rate and regular rhythm.     Pulses: Normal pulses.     Heart sounds: Normal heart sounds.  Pulmonary:     Effort: Pulmonary effort is normal. No respiratory distress.     Breath sounds: Normal breath sounds. No wheezing.  Abdominal:     Comments: Soft, nondistended.  No significant abdominal TTP.  Positive right-sided CVAT.  Negative left-sided CVAT.  Skin:    General: Skin is dry.     Capillary Refill: Capillary refill takes less than 2 seconds.  Neurological:     Mental Status: He is alert.     GCS: GCS eye subscore is 4. GCS verbal subscore is 5. GCS motor subscore is 6.  Psychiatric:        Mood and Affect: Mood normal.        Behavior: Behavior normal.        Thought Content: Thought content normal.     ED Results / Procedures / Treatments   Labs (all labs ordered are listed, but only abnormal results are displayed) Labs Reviewed  URINALYSIS, ROUTINE W REFLEX MICROSCOPIC - Abnormal; Notable for the following components:      Result Value   APPearance HAZY (*)    Hgb urine dipstick LARGE (*)    Ketones, ur 5 (*)    Protein, ur 30 (*)    Leukocytes,Ua SMALL (*)    RBC / HPF >50 (*)    Bacteria, UA MANY (*)    All other components within normal limits    EKG None  Radiology CT Renal Stone Study  Result Date: 12/01/2019 CLINICAL DATA:  Flank pain and kidney stone. EXAM: CT ABDOMEN AND PELVIS WITHOUT CONTRAST TECHNIQUE: Multidetector CT imaging of the abdomen and pelvis was performed following the standard protocol without IV contrast. COMPARISON:  06/04/2016 FINDINGS: Lower chest: No acute abnormality. Hepatobiliary:  No focal liver abnormality is seen. No gallstones, gallbladder wall thickening, or biliary dilatation. Pancreas: Unremarkable. No pancreatic ductal dilatation or surrounding inflammatory changes. Spleen: Normal in size without  focal abnormality. Adrenals/Urinary Tract: Normal appearance of the adrenal glands. Mild right-sided hydronephrosis2 and hydroureter identified. There is a stone within the urinary bladder in the expected location of the right UVJ which measures 2-3 mm, image 44/6 and image 77/3. No calcification identified within either ureter. Stomach/Bowel: Stomach is within normal limits. Appendix appears normal. No evidence of bowel wall thickening, distention, or inflammatory changes. Vascular/Lymphatic: No significant vascular findings are present. No enlarged abdominal or pelvic lymph nodes. Reproductive: Prostate is unremarkable. Other: No free fluid or fluid collections. Musculoskeletal: No acute or significant osseous findings. IMPRESSION: Right-sided hydronephrosis and hydroureter identified. There is a 2-3 mm stone within the urinary bladder in the expected location of the right UVJ. Electronically Signed   By: Signa Kell M.D.   On: 12/01/2019 13:00    Procedures Procedures (including critical care time)  Medications Ordered in ED Medications  ondansetron (ZOFRAN-ODT) disintegrating tablet 4 mg (4 mg Oral Given 12/01/19 1237)  LORazepam (ATIVAN) tablet 1 mg (1 mg Oral Given 12/01/19 1237)  ibuprofen (ADVIL) tablet 800 mg (800 mg Oral Given 12/01/19 1357)    ED Course  I have reviewed the triage vital signs and the nursing notes.  Pertinent labs & imaging results that were available during my care of the patient were reviewed by me and considered in my medical decision making (see chart for details).    MDM Rules/Calculators/A&P                          We will treat with Toradol 60 mg IM given patient does not want IV placement.  We will also provide patient with 4 mg  Zofran ODT for nausea in conjunction with 1 mg p.o. Ativan for his profound anxiety.  He denies any history of prolonged QT.  No chest pain or shortness of breath.  Hopefully we can obtain basic laboratory work-up to assess renal function.  UA pending.  We will proceed with CT renal stone study.  Imaging I personally reviewed CT renal stone study which demonstrates a 2 to 3 mm stone within the urinary bladder near UVJ with associated right-sided hydronephrosis/hydroureter.  Patient declined the IM Toradol and even after receiving the Ativan could not proceed with laboratory work-up.  He denies any history of renal impairment.  While he does have hydroureter and hydronephrosis on exam, it appears as though his stone is about to pass if it has not already passed into the bladder.  It is 2 to 3 mm in size and should pass spontaneously.   Patient does not have any medical insurance or outpatient follow-up.  No PCP.  While his UA does demonstrate bacteria and WBC, he is nitrite negative and he denied any symptoms of UTI prior to onset of his stone.  Lower suspicion for obstructing infected kidney stone at this time.  Per radiology, the stone appears to be within the urinary bladder.  Clinically, he feels significantly improved and states that his 10 out of 10 pain has improved to 2 out of 10 residual "aching".  He feels as though he may have even passed the stone in his urine.  Cautioned patient on signs and symptoms of infection.  He will return to the ED or seek immediate medical attention if he develops any new or worsening symptoms, including fevers or chills.  Will discharge him home with tamsulosin, ibuprofen and Vicodin.  All of the evaluation and work-up results were discussed with the patient and  any family at bedside.  Patient and/or family were informed that while patient is appropriate for discharge at this time, some medical emergencies may only develop or become detectable after a period of  time.  I specifically instructed patient and/or family to return to return to the ED or seek immediate medical attention for any new or worsening symptoms.  They were provided opportunity to ask any additional questions and have none at this time.  Prior to discharge patient is feeling well, agreeable with plan for discharge home.  They have expressed understanding of verbal discharge instructions as well as return precautions and are agreeable to the plan.   Patient counseled to never drive or operate heavy machinery while taking narcotic or other sedating medication.   Final Clinical Impression(s) / ED Diagnoses Final diagnoses:  Ureterolithiasis    Rx / DC Orders ED Discharge Orders         Ordered    ibuprofen (ADVIL) 600 MG tablet  Every 6 hours PRN        12/01/19 1326    HYDROcodone-acetaminophen (NORCO/VICODIN) 5-325 MG tablet  Every 6 hours PRN        12/01/19 1326    tamsulosin (FLOMAX) 0.4 MG CAPS capsule  Daily        12/01/19 1326           Lorelee NewGreen, Eilan Mcinerny L, PA-C 12/01/19 1357    Mancel BaleWentz, Elliott, MD 12/04/19 [phone]

## 2019-12-01 NOTE — ED Notes (Signed)
Pt transported to CT ?

## 2019-12-01 NOTE — ED Triage Notes (Signed)
Pt reports waking this am with Right flank pain, increase pain w/urination, bloody urine after trying to give a urine sample here

## 2019-12-01 NOTE — Discharge Instructions (Signed)
Please read the attachment on kidney stones.  You have a small, 2-3 mm stone that appears to have passed into your bladder.    Please take your medications, as directed.  Please take the ibuprofen 600 mg every 6 hours as needed for pain.  Take the Vicodin only for breakthrough pain symptoms. You were prescribed a narcotic. Do not drive. Do not use machinery or power tools. Do not sign legal documents. Do not drink alcohol. Do not take sleeping pills. Do not supervise children by yourself. Do not participate in activities that require climbing or being in high places.  Please have a low threshold to return to the ED or seek immediate medical attention should you experience any fevers, chills, or other new or worsening symptoms.  Otherwise, I strongly encourage you to follow-up with primary care.  Please call the [facility] to get established with a PCP.

## 2019-12-01 NOTE — ED Notes (Signed)
Pt d/c home per MD order. Discharge summary reviewed with pt, pt verbalizes understanding. Ambulatory off unit. No s/s of acute distress noted. Discharged home with visitor.  

## 2019-12-01 NOTE — ED Notes (Signed)
Pt refusing labs- this nurse notified ED provider.

## 2022-03-31 IMAGING — CT CT RENAL STONE PROTOCOL
2 of 4 series · 16 of 46 positions shown, 18 images · non-contrast
Comparison: 06/04/2016

CLINICAL DATA: Flank pain and kidney stone.

EXAM:
CT ABDOMEN AND PELVIS WITHOUT CONTRAST
TECHNIQUE: Multidetector CT imaging of the abdomen and pelvis was performed
following the standard protocol without IV contrast.

[Series 3: renal stone 5.0 · axial · 0.73mm/px · z∈[+674,+1104]mm · 13 of 96 slices shown, 15 images]
[im 5/96  soft-tissue]
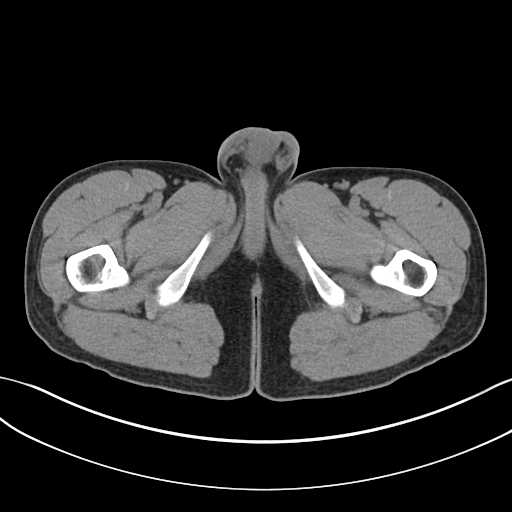
[im 5/96  bone]
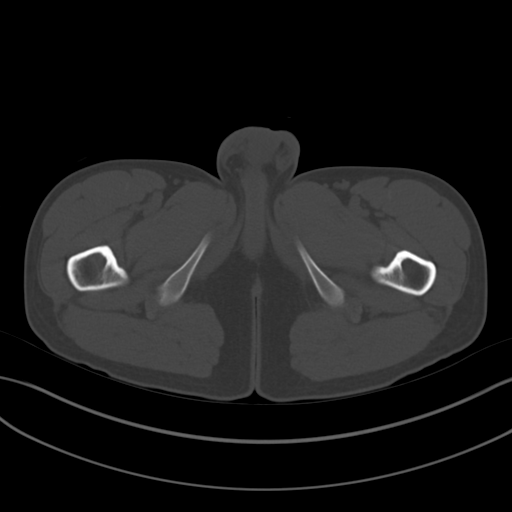
[im 13/96  soft-tissue]
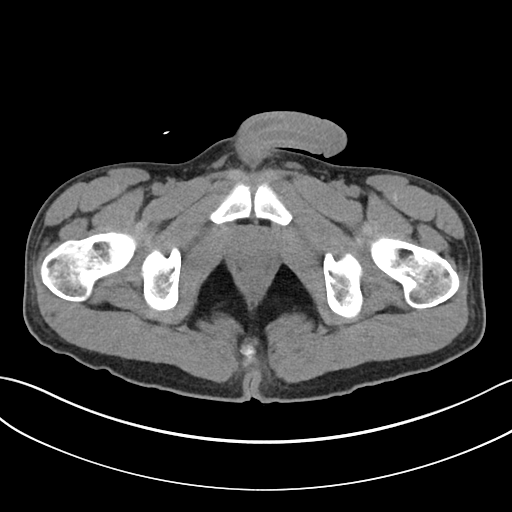
[im 21/96  soft-tissue]
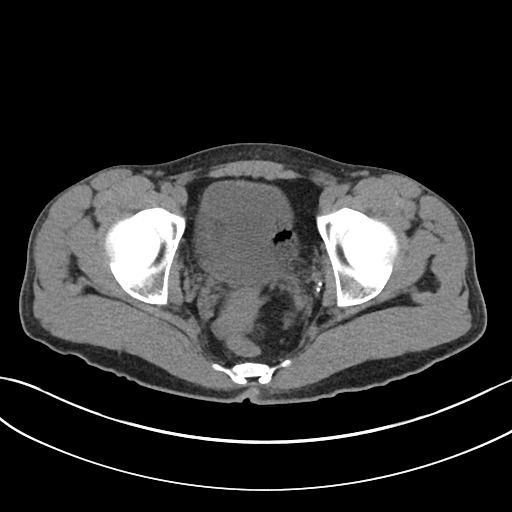
[im 25/96  soft-tissue]
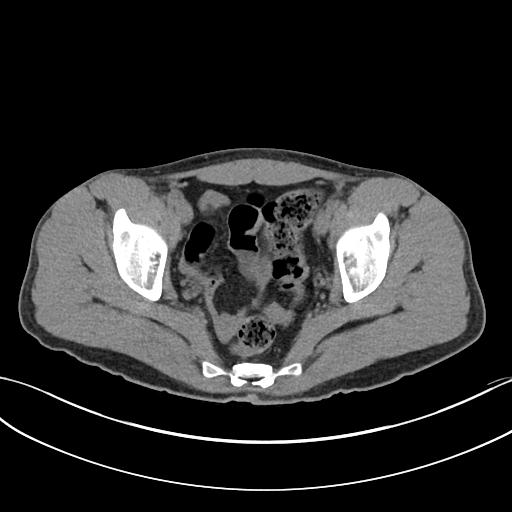
[im 34/96  soft-tissue]
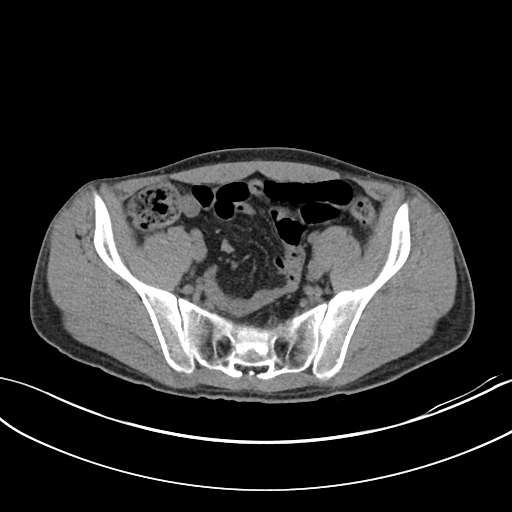
[im 42/96  soft-tissue]
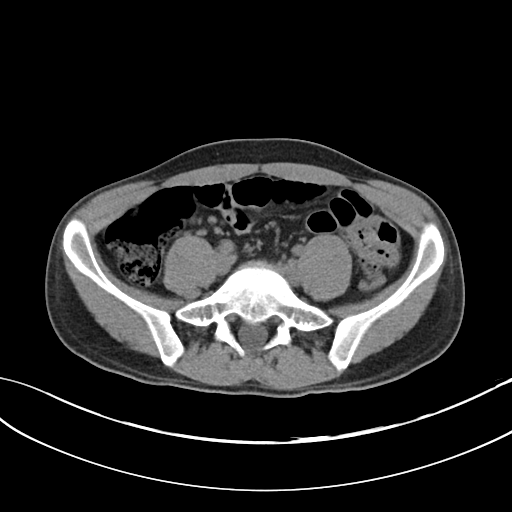
[im 50/96  soft-tissue]
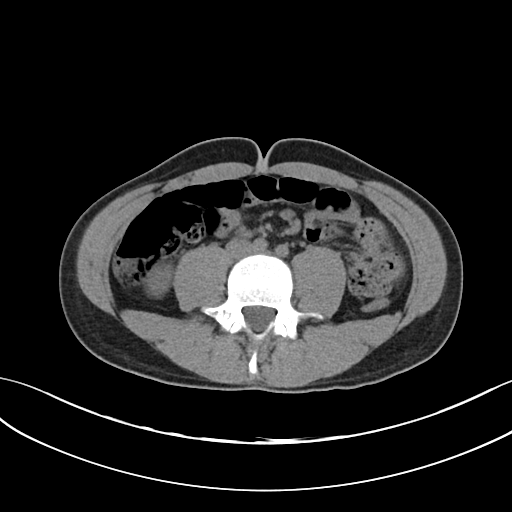
[im 54/96  soft-tissue]
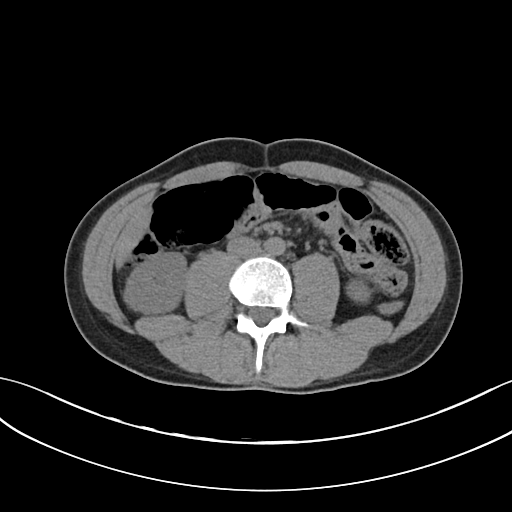
[im 62/96  soft-tissue]
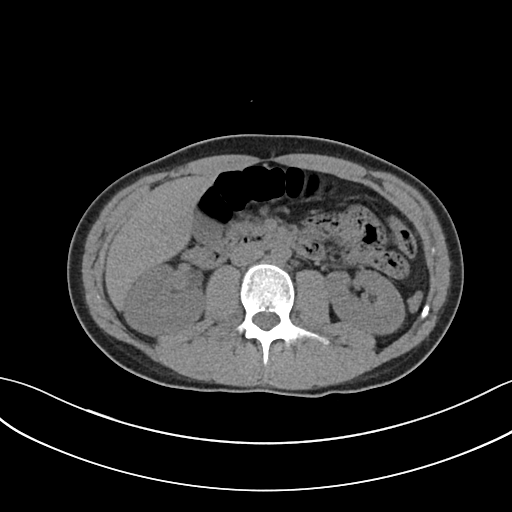
[im 62/96  bone]
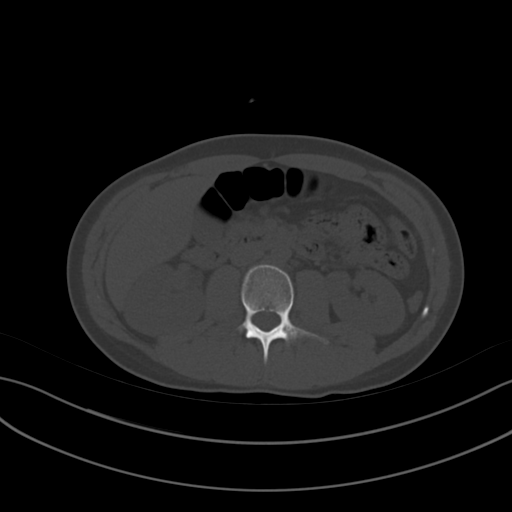
[im 71/96  soft-tissue]
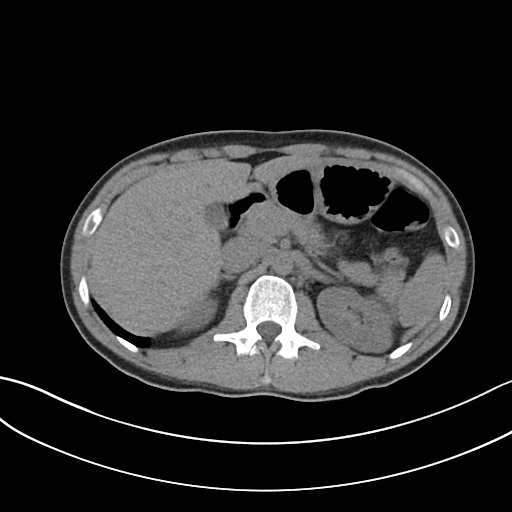
[im 75/96  soft-tissue]
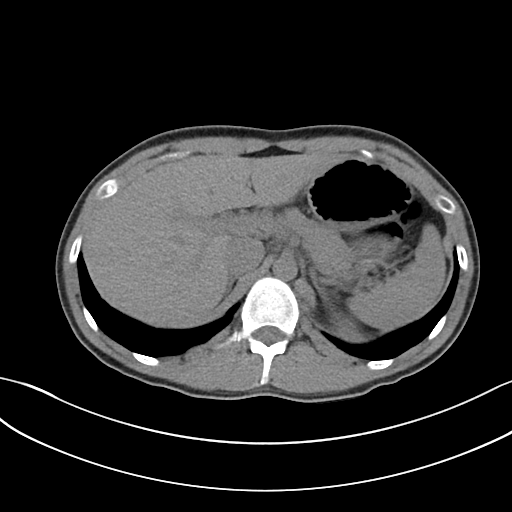
[im 83/96  soft-tissue]
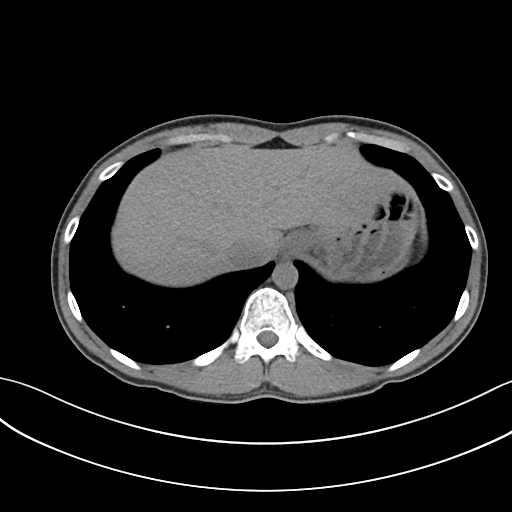
[im 91/96  soft-tissue]
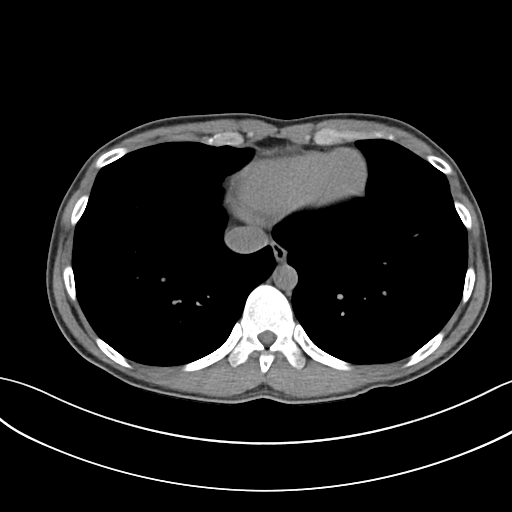

[Series 6: coronal · coronal · 0.80mm/px · 3 of 87 slices shown]
[im 29/87  soft-tissue]
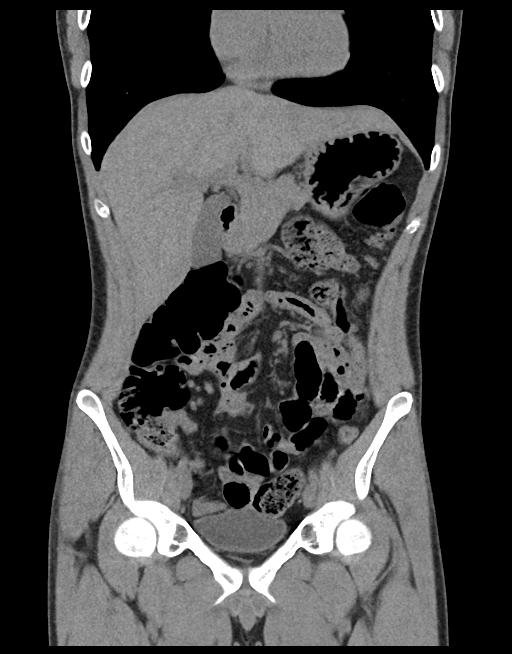
[im 39/87  soft-tissue]
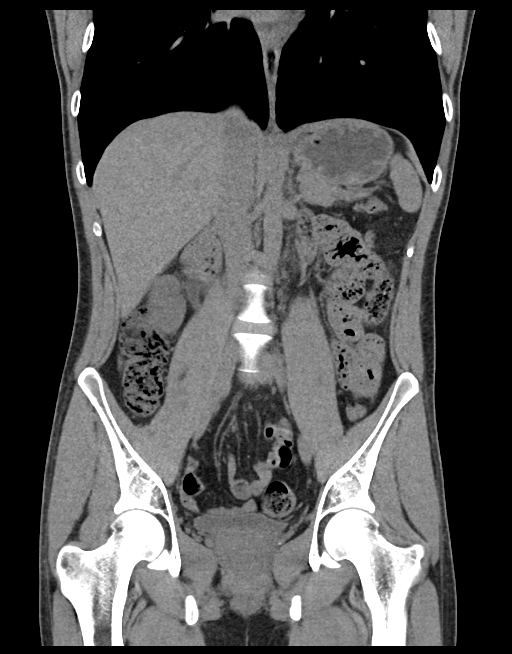
[im 48/87  soft-tissue]
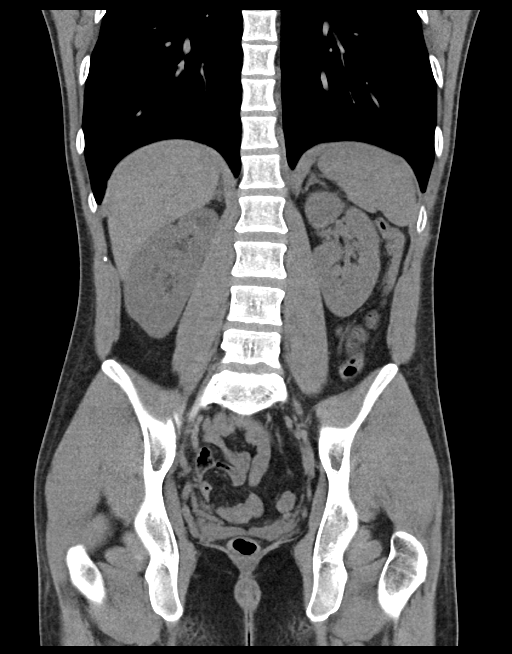

[16 of 46 positions shown; findings below may reference images not displayed]

FINDINGS: Lower chest: No acute abnormality.

Hepatobiliary: No focal liver abnormality is seen. No gallstones,
gallbladder wall thickening, or biliary dilatation.

Pancreas: Unremarkable. No pancreatic ductal dilatation or
surrounding inflammatory changes.

Spleen: Normal in size without focal abnormality.

Adrenals/Urinary Tract: Normal appearance of the adrenal glands.
Mild right-sided hydronephrosis6 and hydroureter identified. There
is a stone within the urinary bladder in the expected location of
the right UVJ which measures 2-3 mm, image 44/6 and image 77/3. No
calcification identified within either ureter.

Stomach/Bowel: Stomach is within normal limits. Appendix appears
normal. No evidence of bowel wall thickening, distention, or
inflammatory changes.

Vascular/Lymphatic: No significant vascular findings are present. No
enlarged abdominal or pelvic lymph nodes.

Reproductive: Prostate is unremarkable.

Other: No free fluid or fluid collections.

Musculoskeletal: No acute or significant osseous findings.
IMPRESSION: Right-sided hydronephrosis and hydroureter identified. There is a
2-3 mm stone within the urinary bladder in the expected location of
the right UVJ.
